# Patient Record
Sex: Female | Born: 2000 | Race: White | Hispanic: No | Marital: Single | Smoking: Never smoker
Health system: Southern US, Community
[De-identification: ages and names within clinical notes are randomized; demographics above are authoritative.]

## PROBLEM LIST (undated history)

## (undated) DIAGNOSIS — J189 Pneumonia, unspecified organism: Secondary | ICD-10-CM

## (undated) DIAGNOSIS — Z8669 Personal history of other diseases of the nervous system and sense organs: Secondary | ICD-10-CM

## (undated) DIAGNOSIS — Z973 Presence of spectacles and contact lenses: Secondary | ICD-10-CM

## (undated) DIAGNOSIS — Z8781 Personal history of (healed) traumatic fracture: Secondary | ICD-10-CM

## (undated) DIAGNOSIS — J181 Lobar pneumonia, unspecified organism: Secondary | ICD-10-CM

## (undated) DIAGNOSIS — T7840XA Allergy, unspecified, initial encounter: Secondary | ICD-10-CM

## (undated) DIAGNOSIS — S43001A Unspecified subluxation of right shoulder joint, initial encounter: Secondary | ICD-10-CM

## (undated) HISTORY — DX: Allergy, unspecified, initial encounter: T78.40XA

## (undated) HISTORY — DX: Personal history of other diseases of the nervous system and sense organs: Z86.69

## (undated) HISTORY — PX: TYMPANOSTOMY: SHX2586

## (undated) HISTORY — DX: Personal history of (healed) traumatic fracture: Z87.81

## (undated) HISTORY — DX: Presence of spectacles and contact lenses: Z97.3

## (undated) HISTORY — DX: Pneumonia, unspecified organism: J18.9

## (undated) HISTORY — DX: Lobar pneumonia, unspecified organism: J18.1

## (undated) HISTORY — DX: Unspecified subluxation of right shoulder joint, initial encounter: S43.001A

## (undated) HISTORY — PX: TYMPANOSTOMY TUBE PLACEMENT: SHX32

---
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
## 2018-03-06 ENCOUNTER — Telehealth: Payer: Self-pay | Admitting: Allergy and Immunology

## 2018-03-06 NOTE — Telephone Encounter (Signed)
Mom called to see if we have some samples and coupon for Xyzal for her daughter at college. They are going  There  tomorrow  [phone].

## 2018-03-06 NOTE — Telephone Encounter (Signed)
Got samples and coupon from nurse and called mom and she is coming to pick them up.

## 2018-05-16 ENCOUNTER — Other Ambulatory Visit (HOSPITAL_COMMUNITY)
Admission: RE | Admit: 2018-05-16 | Discharge: 2018-05-16 | Disposition: A | Discharge status: Home / Self Care | Payer: 59 | Source: Ambulatory Visit | Attending: Internal Medicine | Admitting: Internal Medicine

## 2018-05-16 ENCOUNTER — Encounter: Payer: Self-pay | Admitting: Internal Medicine

## 2018-05-16 ENCOUNTER — Ambulatory Visit: Payer: 59 | Admitting: Internal Medicine

## 2018-05-16 VITALS — BP 90/60 | HR 73 | Temp 97.9°F | Wt 136.8 lb

## 2018-05-16 DIAGNOSIS — Z113 Encounter for screening for infections with a predominantly sexual mode of transmission: Secondary | ICD-10-CM | POA: Diagnosis not present

## 2018-05-16 DIAGNOSIS — N898 Other specified noninflammatory disorders of vagina: Secondary | ICD-10-CM

## 2018-05-16 NOTE — Patient Instructions (Addendum)
This could be    BV  Bacterial vaginosis   But we can wait for results.   Checking for sti  Yeast and  BV germs  We can treat after results back or for BV    Vaginitis Vaginitis is a condition in which the vaginal tissue swells and becomes red (inflamed). This condition is most often caused by a change in the normal balance of bacteria and yeast that live in the vagina. This change causes an overgrowth of certain bacteria or yeast, which causes the inflammation. There are different types of vaginitis, but the most common types are:  Bacterial vaginosis.  Yeast infection (candidiasis).  Trichomoniasis vaginitis. This is a sexually transmitted disease (STD).  Viral vaginitis.  Atrophic vaginitis.  Allergic vaginitis.  What are the causes? The cause of this condition depends on the type of vaginitis. It can be caused by:  Bacteria (bacterial vaginosis).  Yeast, which is a fungus (yeast infection).  A parasite (trichomoniasis vaginitis).  A virus (viral vaginitis).  Low hormone levels (atrophic vaginitis). Low hormone levels can occur during pregnancy, breastfeeding, or after menopause.  Irritants, such as bubble baths, scented tampons, and feminine sprays (allergic vaginitis).  Other factors can change the normal balance of the yeast and bacteria that live in the vagina. These include:  Antibiotic medicines.  Poor hygiene.  Diaphragms, vaginal sponges, spermicides, birth control pills, and intrauterine devices (IUD).  Sex.  Infection.  Uncontrolled diabetes.  A weakened defense (immune) system.  What increases the risk? This condition is more likely to develop in women who:  Smoke.  Use vaginal douches, scented tampons, or scented sanitary pads.  Wear tight-fitting pants.  Wear thong underwear.  Use oral birth control pills or an IUD.  Have sex without a condom.  Have multiple sex partners.  Have an STD.  Frequently use the spermicide  nonoxynol-9.  Eat lots of foods high in sugar.  Have uncontrolled diabetes.  Have low estrogen levels.  Have a weakened immune system from an immune disorder or medical treatment.  Are pregnant or breastfeeding.  What are the signs or symptoms? Symptoms vary depending on the cause of the vaginitis. Common symptoms include:  Abnormal vaginal discharge. ? The discharge is white, gray, or yellow with bacterial vaginosis. ? The discharge is thick, white, and cheesy with a yeast infection. ? The discharge is frothy and yellow or greenish with trichomoniasis.  A bad vaginal smell. The smell is fishy with bacterial vaginosis.  Vaginal itching, pain, or swelling.  Sex that is painful.  Pain or burning when urinating.  Sometimes there are no symptoms. How is this diagnosed? This condition is diagnosed based on your symptoms and medical history. A physical exam, including a pelvic exam, will also be done. You may also have other tests, including:  Tests to determine the pH level (acidity or alkalinity) of your vagina.  A whiff test, to assess the odor that results when a sample of your vaginal discharge is mixed with a potassium hydroxide solution.  Tests of vaginal fluid. A sample will be examined under a microscope.  How is this treated? Treatment varies depending on the type of vaginitis you have. Your treatment may include:  Antibiotic creams or pills to treat bacterial vaginosis and trichomoniasis.  Antifungal medicines, such as vaginal creams or suppositories, to treat a yeast infection.  Medicine to ease discomfort if you have viral vaginitis. Your sexual partner should also be treated.  Estrogen delivered in a cream, pill,  suppository, or vaginal ring to treat atrophic vaginitis. If vaginal dryness occurs, lubricants and moisturizing creams may help. You may need to avoid scented soaps, sprays, or douches.  Stopping use of a product that is causing allergic vaginitis.  Then using a vaginal cream to treat the symptoms.  Follow these instructions at home: Lifestyle  Keep your genital area clean and dry. Avoid soap, and only rinse the area with water.  Do not douche or use tampons until your health care provider says it is okay to do so. Use sanitary pads, if needed.  Do not have sex until your health care provider approves. When you can return to sex, practice safe sex and use condoms.  Wipe from front to back. This avoids the spread of bacteria from the rectum to the vagina. General instructions  Take over-the-counter and prescription medicines only as told by your health care provider.  If you were prescribed an antibiotic medicine, take or use it as told by your health care provider. Do not stop taking or using the antibiotic even if you start to feel better.  Keep all follow-up visits as told by your health care provider. This is important. How is this prevented?  Use mild, non-scented products. Do not use things that can irritate the vagina, such as fabric softeners. Avoid the following products if they are scented: ? Feminine sprays. ? Detergents. ? Tampons. ? Feminine hygiene products. ? Soaps or bubble baths.  Let air reach your genital area. ? Wear cotton underwear to reduce moisture buildup. ? Avoid wearing underwear while you sleep. ? Avoid wearing tight pants and underwear or nylons without a cotton panel. ? Avoid wearing thong underwear.  Take off any wet clothing, such as bathing suits, as soon as possible.  Practice safe sex and use condoms. Contact a health care provider if:  You have abdominal pain.  You have a fever.  You have symptoms that last for more than 2-3 days. Get help right away if:  You have a fever and your symptoms suddenly get worse. Summary  Vaginitis is a condition in which the vaginal tissue becomes inflamed.This condition is most often caused by a change in the normal balance of bacteria and yeast  that live in the vagina.  Treatment varies depending on the type of vaginitis you have.  Do not douche, use tampons , or have sex until your health care provider approves. When you can return to sex, practice safe sex and use condoms. This information is not intended to replace advice given to you by your health care provider. Make sure you discuss any questions you have with your health care provider. Document Released: 09/16/2007 Document Revised: 12/25/2016 Document Reviewed: 12/25/2016 Elsevier Interactive Patient Education  [employer].

## 2018-05-16 NOTE — Progress Notes (Signed)
Chief Complaint  Patient presents with  . Vaginal Itching    x2-3 weeks, vaginal discharge which is white-gray in color intermittently    HPI: Breanna Taylor 17 y.o. come in for  SDA  No hx of same vaginal sx     Like itchy painful but no dc in past . Has fishy odor somewhat    Weeks ago .  No  recent antibiotics  lmp 1 month  Due any day    Regular  4 days.   On ocps  Over 2 year.  No rx.   1 partner  Months ago  Condoms  Not now  [employer]. Tight scented   At times  Fem products  ROS: See pertinent positives and negatives per HPI.  Past Medical History:  Diagnosis Date  . Allergy   . Hx of fracture of toe   . Hx of otitis media    tis  . Pneumonia   . RLL pneumonia (HCC) 11/20/2013  . Shoulder subluxation, right 04/21/2013  . Wears glasses    myopia hx of blepharitis    Family History  Problem Relation Age of Onset  . Healthy Sister   . Eczema Sister   . Asthma Mother   . Allergic rhinitis Neg Hx   . Angioedema Neg Hx   . Immunodeficiency Neg Hx   . Urticaria Neg Hx     Social History   Socioeconomic History  . Marital status: Single    Spouse name: Not on file  . Number of children: Not on file  . Years of education: Not on file  . Highest education level: Not on file  Occupational History  . Not on file  Social Needs  . Financial resource strain: Not on file  . Food insecurity:    Worry: Not on file    Inability: Not on file  . Transportation needs:    Medical: Not on file    Non-medical: Not on file  Tobacco Use  . Smoking status: Never Smoker  . Smokeless tobacco: Never Used  Substance and Sexual Activity  . Alcohol use: No  . Drug use: No  . Sexual activity: Not on file  Lifestyle  . Physical activity:    Days per week: Not on file    Minutes per session: Not on file  . Stress: Not on file  Relationships  . Social connections:    Talks on phone: Not on file    Gets together: Not on file    Attends religious service: Not  on file    Active member of club or organization: Not on file    Attends meetings of clubs or organizations: Not on file    Relationship status: Not on file  Other Topics Concern  . 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   To page   IB program                 Outpatient Medications Prior to Visit  Medication Sig Dispense Refill  . Ascorbic Acid (VITAMIN C) 100 MG tablet Take 100 mg by mouth daily.    . cetirizine (ZYRTEC) 10 MG tablet Take 1 tablet (10 mg total) by mouth daily. 90 tablet 2  . desogestrel-ethinyl estradiol (ENSKYCE) 0.15-30 MG-MCG tablet Take 1 tablet by mouth daily. 3 Package 3  . mometasone (NASONEX) 50 MCG/ACT nasal spray 1 spray per  nostril 1-2 times daily as needed. 51 g 2  . Olopatadine HCl (PAZEO) 0.7 % SOLN Place 1 drop into both eyes daily. 6 Bottle 0   No facility-administered medications prior to visit.      EXAM:  BP 90/60 (BP Location: Left Arm, Patient Position: Sitting, Cuff Size: Normal)   Pulse 73   Temp 97.9 F (36.6 C) (Oral)   Wt 136 lb 12.8 oz (62.1 kg)   LMP 04/15/2018 (Exact Date)   BMI 21.43 kg/m   Body mass index is 21.43 kg/m.  GENERAL: vitals reviewed and listed above, alert, oriented, appears well hydrated and in no acute distress HEENT: atraumatic, conjunctiva  clear, no obvious abnormalities on inspection of external nose and ears  Ext gu  Clear min erythema no lesions  cx 1+ flat ectopy .  Homogeneous  Creamy whit yellow   2+  aptima     gc  chl trich y bv  MS: moves all extremities without noticeable focal  abnormality PSYCH: pleasant and cooperative, no obvious depression or anxiety  BP Readings from Last 3 Encounters:  05/16/18 90/60  10/23/17 96/64  03/04/17 112/68 (51 %, Z = 0.02 /  56 %, Z = 0.15)*   *BP percentiles are based on the August 2017 AAP Clinical Practice Guideline for girls    ASSESSMENT AND PLAN:  Discussed the following assessment and  plan:  Vaginal itching - Plan: Cervicovaginal ancillary only  Vaginal discharge  -Patient advised to return or notify health care team  if  new concerns arise.  Patient Instructions    This could be    BV  Bacterial vaginosis   But we can wait for results.   Checking for sti  Yeast and  BV germs  We can treat after results back or for BV    Vaginitis Vaginitis is a condition in which the vaginal tissue swells and becomes red (inflamed). This condition is most often caused by a change in the normal balance of bacteria and yeast that live in the vagina. This change causes an overgrowth of certain bacteria or yeast, which causes the inflammation. There are different types of vaginitis, but the most common types are:  Bacterial vaginosis.  Yeast infection (candidiasis).  Trichomoniasis vaginitis. This is a sexually transmitted disease (STD).  Viral vaginitis.  Atrophic vaginitis.  Allergic vaginitis.  What are the causes? The cause of this condition depends on the type of vaginitis. It can be caused by:  Bacteria (bacterial vaginosis).  Yeast, which is a fungus (yeast infection).  A parasite (trichomoniasis vaginitis).  A virus (viral vaginitis).  Low hormone levels (atrophic vaginitis). Low hormone levels can occur during pregnancy, breastfeeding, or after menopause.  Irritants, such as bubble baths, scented tampons, and feminine sprays (allergic vaginitis).  Other factors can change the normal balance of the yeast and bacteria that live in the vagina. These include:  Antibiotic medicines.  Poor hygiene.  Diaphragms, vaginal sponges, spermicides, birth control pills, and intrauterine devices (IUD).  Sex.  Infection.  Uncontrolled diabetes.  A weakened defense (immune) system.  What increases the risk? This condition is more likely to develop in women who:  Smoke.  Use vaginal douches, scented tampons, or scented sanitary pads.  Wear tight-fitting  pants.  Wear thong underwear.  Use oral birth control pills or an IUD.  Have sex without a condom.  Have multiple sex partners.  Have an STD.  Frequently use the spermicide nonoxynol-9.  Eat lots of foods high in  sugar.  Have uncontrolled diabetes.  Have low estrogen levels.  Have a weakened immune system from an immune disorder or medical treatment.  Are pregnant or breastfeeding.  What are the signs or symptoms? Symptoms vary depending on the cause of the vaginitis. Common symptoms include:  Abnormal vaginal discharge. ? The discharge is white, gray, or yellow with bacterial vaginosis. ? The discharge is thick, white, and cheesy with a yeast infection. ? The discharge is frothy and yellow or greenish with trichomoniasis.  A bad vaginal smell. The smell is fishy with bacterial vaginosis.  Vaginal itching, pain, or swelling.  Sex that is painful.  Pain or burning when urinating.  Sometimes there are no symptoms. How is this diagnosed? This condition is diagnosed based on your symptoms and medical history. A physical exam, including a pelvic exam, will also be done. You may also have other tests, including:  Tests to determine the pH level (acidity or alkalinity) of your vagina.  A whiff test, to assess the odor that results when a sample of your vaginal discharge is mixed with a potassium hydroxide solution.  Tests of vaginal fluid. A sample will be examined under a microscope.  How is this treated? Treatment varies depending on the type of vaginitis you have. Your treatment may include:  Antibiotic creams or pills to treat bacterial vaginosis and trichomoniasis.  Antifungal medicines, such as vaginal creams or suppositories, to treat a yeast infection.  Medicine to ease discomfort if you have viral vaginitis. Your sexual partner should also be treated.  Estrogen delivered in a cream, pill, suppository, or vaginal ring to treat atrophic vaginitis. If vaginal  dryness occurs, lubricants and moisturizing creams may help. You may need to avoid scented soaps, sprays, or douches.  Stopping use of a product that is causing allergic vaginitis. Then using a vaginal cream to treat the symptoms.  Follow these instructions at home: Lifestyle  Keep your genital area clean and dry. Avoid soap, and only rinse the area with water.  Do not douche or use tampons until your health care provider says it is okay to do so. Use sanitary pads, if needed.  Do not have sex until your health care provider approves. When you can return to sex, practice safe sex and use condoms.  Wipe from front to back. This avoids the spread of bacteria from the rectum to the vagina. General instructions  Take over-the-counter and prescription medicines only as told by your health care provider.  If you were prescribed an antibiotic medicine, take or use it as told by your health care provider. Do not stop taking or using the antibiotic even if you start to feel better.  Keep all follow-up visits as told by your health care provider. This is important. How is this prevented?  Use mild, non-scented products. Do not use things that can irritate the vagina, such as fabric softeners. Avoid the following products if they are scented: ? Feminine sprays. ? Detergents. ? Tampons. ? Feminine hygiene products. ? Soaps or bubble baths.  Let air reach your genital area. ? Wear cotton underwear to reduce moisture buildup. ? Avoid wearing underwear while you sleep. ? Avoid wearing tight pants and underwear or nylons without a cotton panel. ? Avoid wearing thong underwear.  Take off any wet clothing, such as bathing suits, as soon as possible.  Practice safe sex and use condoms. Contact a health care provider if:  You have abdominal pain.  You have a fever.  You have  symptoms that last for more than 2-3 days. Get help right away if:  You have a fever and your symptoms suddenly get  worse. Summary  Vaginitis is a condition in which the vaginal tissue becomes inflamed.This condition is most often caused by a change in the normal balance of bacteria and yeast that live in the vagina.  Treatment varies depending on the type of vaginitis you have.  Do not douche, use tampons , or have sex until your health care provider approves. When you can return to sex, practice safe sex and use condoms. This information is not intended to replace advice given to you by your health care provider. Make sure you discuss any questions you have with your health care provider. Document Released: 09/16/2007 Document Revised: 12/25/2016 Document Reviewed: 12/25/2016 Elsevier Interactive Patient Education  2018 [employer]       WestlakeWanda K. Martisha Toulouse M.D.

## 2018-05-21 LAB — CERVICOVAGINAL ANCILLARY ONLY
Bacterial vaginitis: NEGATIVE
CANDIDA VAGINITIS: POSITIVE — AB
Chlamydia: NEGATIVE
Neisseria Gonorrhea: NEGATIVE
Trichomonas: NEGATIVE

## 2018-05-22 ENCOUNTER — Telehealth: Payer: Self-pay | Admitting: *Deleted

## 2018-05-22 NOTE — Telephone Encounter (Signed)
Copied from CRM [id number]. Topic: Inquiry >> May 22, 2018  1:21 PM Alexander BergeronBarksdale, Harvey B wrote: Reason for CRM: pt called to get lab results from last Friday, call pt to advise

## 2018-05-23 ENCOUNTER — Telehealth: Payer: Self-pay | Admitting: Internal Medicine

## 2018-05-23 MED ORDER — FLUCONAZOLE 150 MG PO TABS: 150.0000 mg | ORAL_TABLET | Freq: Once | ORAL | 0 refills | Status: AC

## 2018-05-23 NOTE — Addendum Note (Signed)
Addended by: Aniceto BossNIMMONS, SYLVIA A on: 05/23/2018 03:34 PM   Modules accepted: Orders

## 2018-05-23 NOTE — Telephone Encounter (Signed)
See open message 05/22/18

## 2018-05-23 NOTE — Telephone Encounter (Addendum)
See result note 05/16/18 Note [facility] to [facility] at 10:39AM after patient was called.

## 2018-05-23 NOTE — Telephone Encounter (Signed)
Copied from CRM [phone]. Topic: General - Other >> May 22, 2018  5:02 PM Marylen PontoMcneil, Ja-Kwan wrote: Reason for CRM: Pt called in again for lab results. Requests return call. Cb# [phone]

## 2018-05-23 NOTE — Telephone Encounter (Signed)
Patient states she leaves for the beach this evening and would like something called in before then. She states she has been waiting for 2 hours? Please advise.

## 2018-05-23 NOTE — Telephone Encounter (Signed)
Pt given results per notes of Dr. Fabian SharpPanosh on 05/22/18, patient verbalized understanding. She asks which is better to use, I advised both are effective, but the choice to make is using the cream for 3-7 days, depending on the type bought, and taking a pill for one time and not worrying about the cream. I advised with the pill, if the symptoms do not get better, she is to call back and ask for another prescriptions sent, she verbalized understanding. She asks how much will the pill cost, I advised to check with the pharmacy and they will be able to tell her once it is run through with the insurance, she verbalized understanding. Unable to document in result note due to [facility].

## 2018-05-23 NOTE — Telephone Encounter (Signed)
I spoke with pt and sent in script for Diflucan, this was suggested based on recent lab results from Dr. Fabian SharpPanosh.

## 2018-06-18 ENCOUNTER — Telehealth: Payer: Self-pay | Admitting: Internal Medicine

## 2018-06-18 NOTE — Telephone Encounter (Signed)
Received a call from patient Mother on 06/17/18 requesting explanation as to why specific testing was done on her daughter without parental consent, why certain tests were ordered which they received a bill for and Mother stating that the patient was very upset following her appt with Dr Fabian SharpPanosh. I discussed with limited information with Mother her concerns, Mother was advised that given the patient's age, being an adult, she gave verbal consent to evaluation and testing during her 05/16/18 OV with Dr Fabian SharpPanosh. Mother was advised that the testing done was routine for her age and symptoms at the time of her OV - which again was discussed in a confidential conversation between the patient and Dr Fabian SharpPanosh. Mother aware that we are working on the billing side trying to figure out and correct the out of pocket cost for this specific test. Mother aware that I will discuss all concerns with Dr Fabian SharpPanosh and my Office Manager Dustin whom she had spoken to first on 06/16/18 regarding the bill.  I spoke with Dr Fabian SharpPanosh and made aware of all concerns. Dr Fabian SharpPanosh confirmed that all evaluation and testing was discussed thoroughly with the patient and patient gave verbal consent to proceed. Dr Fabian SharpPanosh requests that I reach out to patient and discuss this further with the Patient. Follow up needed on current symptoms/questions/concerns. Aware that I would call patient to discuss.   Called patient and held discussion regarding phone call from her Mother and to follow up on her health since OV.  Pt was very open with me during our discussion, states that she held a lengthy conversation with her Mother regarding phones call made to our office while she was out of town and without her consent. Pt states that she filled out a DPR 05/16/18 [facility] - I advised that we do not have this scanned in the chart and the only DPR on file is 10/2017 which give consent to speak with both her mother and father. I advised that even  with the DPR there are tests with certain levels of sensitivity which we do not discuss with parents/guardians due to a level of HIPPA Violation if th patient is an Adult - pt advised that her HIPPA rights were respected when speaking with her mother on the phone. Pt states that she is doing well and though her symptoms did take about 10-12 to fully resolve, they are completely gone now and she has not residual issues. Pt states that she was okay following her exam on 05/16/18 but was a little shaken because she felt a little "pressure" during her discussion with Dr Fabian SharpPanosh to have the exam and testing/labs -- pt then stated that she knew from her discussion with Dr Fabian SharpPanosh that it was necessary and needed to be done given her history and symptoms. Pt states that what upset her more was the side conversation in the room while having the internal exam, pt states that she felt very uncomfortable and felt like the people in the room were not confident in what they were doing. Pt states that she kept hearing "I dont know what to do, I dont know where this goes..." and she felt the exam too way too long.  I apologized for her feeling uncomfortable at all and she then stated that she feels better about it now but wanted to bring awareness to our office to maybe not talk about it things where the patient can hear.  Pt advised to be sure to fill  out an updated DPR when she comes into the office the next time - pt requests in the meantime that we ONLY speak her regarding her health. Pt states that she has no hard feelings toward our office or any staff and she appreciated the follow up call and being able to discuss her feelings and "vent" all of her concerns.   Pt aware that we are still working on the billing issue and that we will contact her as soon as we have an answer regarding next steps.

## 2018-06-20 NOTE — Telephone Encounter (Signed)
Spoke with Stephanie(lab) and Dustin Public affairs consultant(office manager) regarding next steps with the billing of the patients STI testing.  Judeth CornfieldStephanie reached [facility] and requested assistance in fixing this billing error.  The bill the patient received for testing is believed to be a coding error on behalf of our office.  When running the STI testing it should have been coded with [Z11.3] Routine Screening for STI but instead was coded with Vaginal itching [N89.8].  Per Jama FlavorsAlina with the Lab, she states that a letter needs to be typed up on our office letterhead including, Patient Name DOB Explanation of what happened New Diagnosis Code  [Z11.3] Routine Screening for STI Must be signed my Provider - not stamped  With date/time  Please fax to AttnJama Flavors: Alina  Fax# [phone]

## 2018-06-24 NOTE — Telephone Encounter (Signed)
Letter typed, signed and faxed to [facility].  Fax# [phone]

## 2018-07-01 ENCOUNTER — Telehealth: Payer: Self-pay | Admitting: Internal Medicine

## 2018-07-01 MED ORDER — FLUCONAZOLE 150 MG PO TABS: 150.0000 mg | ORAL_TABLET | Freq: Once | ORAL | 0 refills | Status: AC

## 2018-07-01 NOTE — Telephone Encounter (Signed)
Please advise Dr Fabian SharpPanosh, thanks.  Pt has declined an appointment with Dr Fabian SharpPanosh and is requesting something be called into the pharmacy for another yeast infection.

## 2018-07-01 NOTE — Telephone Encounter (Signed)
Copied from CRM [phone]. Topic: Quick Communication - See Telephone Encounter >> Jul 01, 2018  2:01 PM Terisa Starraylor, Brittany L wrote: CRM for notification. See Telephone encounter for: 07/01/18.  Patient states she she had a yeast infection and was in the office on June 14 th. She said that now she has another yeast infection of about 6 days. Patient declined appointment and wants Dr Rosezella FloridaPanosh's nurse to give her a call back.

## 2018-07-01 NOTE — Telephone Encounter (Signed)
If symptoms are the same as last time  Can  Repeat   Diflucan 150 mg disp 1#  1 time    If recurring  Would next  Try terazole  vaginal  rx   Sometimes the yeast are resistant to the diflucan .

## 2018-07-01 NOTE — Telephone Encounter (Signed)
Pt notified of instructions and verbalized understanding. She states symptoms are the same as last time, although not as intense yet. She states she wants to catch this one before it progresses. Diflucan filled to pharmacy as requested. No further needs at this time.

## 2018-07-02 NOTE — Telephone Encounter (Signed)
Will send to Mickeal Skinnerustin and Carolyn for further follow up on billing side.  Thanks!

## 2018-09-12 ENCOUNTER — Ambulatory Visit (INDEPENDENT_AMBULATORY_CARE_PROVIDER_SITE_OTHER): Payer: Managed Care, Other (non HMO)

## 2018-09-12 DIAGNOSIS — Z23 Encounter for immunization: Secondary | ICD-10-CM | POA: Diagnosis not present

## 2018-10-28 NOTE — Progress Notes (Deleted)
No chief complaint on file.   HPI: Patient  Breanna Taylor  16 y.o. comes in today for Preventive Health Care visit   hda sane eval october  Health Maintenance  Topic Date Due  . HIV Screening  04/04/2014  . TETANUS/TDAP  05/09/2021  . INFLUENZA VACCINE  Completed   Health Maintenance Review LIFESTYLE:  Exercise:   Tobacco/ETS: Alcohol:  Sugar beverages: Sleep: Drug use: no HH of  Work:    ROS:  GEN/ HEENT: No fever, significant weight changes sweats headaches vision problems hearing changes, CV/ PULM; No chest pain shortness of breath cough, syncope,edema  change in exercise tolerance. GI /GU: No adominal pain, vomiting, change in bowel habits. No blood in the stool. No significant GU symptoms. SKIN/HEME: ,no acute skin rashes suspicious lesions or bleeding. No lymphadenopathy, nodules, masses.  NEURO/ PSYCH:  No neurologic signs such as weakness numbness. No depression anxiety. IMM/ Allergy: No unusual infections.  Allergy .   REST of 12 system review negative except as per HPI   Past Medical History:  Diagnosis Date  . Allergy   . Hx of fracture of toe   . Hx of otitis media    tis  . Pneumonia   . RLL pneumonia (HCC) 11/20/2013  . Shoulder subluxation, right 04/21/2013  . Wears glasses    myopia hx of blepharitis    Past Surgical History:  Procedure Laterality Date  . TYMPANOSTOMY    . TYMPANOSTOMY TUBE PLACEMENT      Family History  Problem Relation Age of Onset  . Healthy Sister   . Eczema Sister   . Asthma Mother   . Allergic rhinitis Neg Hx   . Angioedema Neg Hx   . Immunodeficiency Neg Hx   . Urticaria Neg Hx     Social History   Socioeconomic History  . Marital status: Single    Spouse name: Not on file  . Number of children: Not on file  . Years of education: Not on file  . Highest education level: Not on file  Occupational History  . Not on file  Social Needs  . Financial resource strain: Not on file  . Food insecurity:   Worry: Not on file    Inability: Not on file  . Transportation needs:    Medical: Not on file    Non-medical: Not on file  Tobacco Use  . Smoking status: Never Smoker  . Smokeless tobacco: Never Used  Substance and Sexual Activity  . Alcohol use: No  . Drug use: No  . Sexual activity: Not on file  Lifestyle  . Physical activity:    Days per week: Not on file    Minutes per session: Not on file  . Stress: Not on file  Relationships  . Social connections:    Talks on phone: Not on file    Gets together: Not on file    Attends religious service: Not on file    Active member of club or organization: Not on file    Attends meetings of clubs or organizations: Not on file    Relationship status: Not on file  Other Topics Concern  . 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   To page   IB program                 Outpatient Medications Prior to Visit  Medication Sig Dispense Refill  . Ascorbic  Acid (VITAMIN C) 100 MG tablet Take 100 mg by mouth daily.    . cetirizine (ZYRTEC) 10 MG tablet Take 1 tablet (10 mg total) by mouth daily. 90 tablet 2  . desogestrel-ethinyl estradiol (ENSKYCE) 0.15-30 MG-MCG tablet Take 1 tablet by mouth daily. 3 Package 3  . mometasone (NASONEX) 50 MCG/ACT nasal spray 1 spray per nostril 1-2 times daily as needed. 51 g 2  . Olopatadine HCl (PAZEO) 0.7 % SOLN Place 1 drop into both eyes daily. 6 Bottle 0   No facility-administered medications prior to visit.      EXAM:  There were no vitals taken for this visit.  There is no height or weight on file to calculate BMI. Wt Readings from Last 3 Encounters:  05/16/18 136 lb 12.8 oz (62.1 kg) (67 %, Z= 0.44)*  10/23/17 132 lb 8 oz (60.1 kg) (63 %, Z= 0.33)*  03/04/17 123 lb (55.8 kg) (49 %, Z= -0.03)*   * Growth percentiles are based on CDC (Girls, 2-20 Years) data.    Physical Exam: Vital signs reviewed ONG:EXBMGEN:This is a well-developed well-nourished  alert cooperative    who appearsr stated age in no acute distress.  HEENT: normocephalic atraumatic , Eyes: PERRL EOM's full, conjunctiva clear, Nares: paten,t no deformity discharge or tenderness., Ears: no deformity EAC's clear TMs with normal landmarks. Mouth: clear OP, no lesions, edema.  Moist mucous membranes. Dentition in adequate repair. NECK: supple without masses, thyromegaly or bruits. CHEST/PULM:  Clear to auscultation and percussion breath sounds equal no wheeze , rales or rhonchi. No chest wall deformities or tenderness. Breast: normal by inspection . No dimpling, discharge, masses, tenderness or discharge . CV: PMI is nondisplaced, S1 S2 no gallops, murmurs, rubs. Peripheral pulses are full without delay.No JVD .  ABDOMEN: Bowel sounds normal nontender  No guard or rebound, no hepato splenomegal no CVA tenderness.  No hernia. Extremtities:  No clubbing cyanosis or edema, no acute joint swelling or redness no focal atrophy NEURO:  Oriented x3, cranial nerves 3-12 appear to be intact, no obvious focal weakness,gait within normal limits no abnormal reflexes or asymmetrical SKIN: No acute rashes normal turgor, color, no bruising or petechiae. PSYCH: Oriented, good eye contact, no obvious depression anxiety, cognition and judgment appear normal. LN: no cervical axillary inguinal adenopathy  Lab Results  Component Value Date   WBC 6.0 10/24/2016   HGB 13.3 10/24/2016   HCT 40.0 10/24/2016   PLT 281.0 10/24/2016   GLUCOSE 86 10/24/2016   CHOL 185 10/24/2016   TRIG 85.0 10/24/2016   HDL 66.80 10/24/2016   LDLCALC 101 (H) 10/24/2016   ALT 11 10/24/2016   AST 14 10/24/2016   NA 140 10/24/2016   K 4.9 10/24/2016   CL 106 10/24/2016   CREATININE 0.73 10/24/2016   BUN 10 10/24/2016   CO2 24 10/24/2016   TSH 1.00 10/24/2016    BP Readings from Last 3 Encounters:  05/16/18 90/60  10/23/17 96/64  03/04/17 112/68 (51 %, Z = 0.02 /  56 %, Z = 0.15)*   *BP percentiles are based on  the August 2017 AAP Clinical Practice Guideline for girls      ASSESSMENT AND PLAN:  Discussed the following assessment and plan:  Visit for preventive health examination  Patient Care Team: Panosh, Neta MendsWanda K, MD as PCP - General (Internal Medicine) Wallis Martedgerton Kathryn (Podiatry) Fletcher AnonBardelas, Jose A, MD as Consulting Physician (Allergy and Immunology) There are no Patient Instructions on file for this visit.  Neta MendsWanda K.  Panosh M.D.

## 2018-10-29 ENCOUNTER — Encounter: Payer: 59 | Admitting: Internal Medicine

## 2022-11-23 ENCOUNTER — Ambulatory Visit (INDEPENDENT_AMBULATORY_CARE_PROVIDER_SITE_OTHER): Payer: 59

## 2022-11-23 ENCOUNTER — Ambulatory Visit
Admission: EM | Admit: 2022-11-23 | Discharge: 2022-11-23 | Disposition: A | Discharge status: Home / Self Care | Payer: 59 | Attending: Urgent Care | Admitting: Urgent Care

## 2022-11-23 ENCOUNTER — Ambulatory Visit (HOSPITAL_COMMUNITY): Admission: EM | Admit: 2022-11-23 | Discharge: 2022-11-23 | Discharge status: Against Medical Advice | Payer: Self-pay

## 2022-11-23 DIAGNOSIS — S93402A Sprain of unspecified ligament of left ankle, initial encounter: Secondary | ICD-10-CM

## 2022-11-23 DIAGNOSIS — M25572 Pain in left ankle and joints of left foot: Secondary | ICD-10-CM | POA: Diagnosis not present

## 2022-11-23 MED ORDER — NAPROXEN 500 MG PO TABS: 500.0000 mg | ORAL_TABLET | Freq: Two times a day (BID) | ORAL | 0 refills | Status: AC

## 2022-11-23 NOTE — ED Triage Notes (Signed)
Pt c/o twisting left ankle wile hiking ~1-2 hours PTA-to tx room via crutches-NAD

## 2022-11-23 NOTE — ED Provider Notes (Signed)
[facility]  Note:  This document was prepared using Conservation officer, historic buildings and may include unintentional dictation errors.  MRN: [id number] DOB: October 20, 2001  Subjective:   Breanna Taylor is a 21 y.o. female presenting for rolling her left ankle laterally while hiking today.  Patient not since felt significant pain, swelling and difficulty bearing weight.  She is ambulating with crutches.  No current facility-administered medications for this encounter.  Current Outpatient Medications:    Ascorbic Acid (VITAMIN C) 100 MG tablet, Take 100 mg by mouth daily., Disp: , Rfl:    cetirizine (ZYRTEC) 10 MG tablet, Take 1 tablet (10 mg total) by mouth daily., Disp: 90 tablet, Rfl: 2   desogestrel-ethinyl estradiol (ENSKYCE) 0.15-30 MG-MCG tablet, Take 1 tablet by mouth daily., Disp: 3 Package, Rfl: 3   mometasone (NASONEX) 50 MCG/ACT nasal spray, 1 spray per nostril 1-2 times daily as needed., Disp: 51 g, Rfl: 2   naproxen (NAPROSYN) 500 MG tablet, Take 1 tablet (500 mg total) by mouth 2 (two) times daily with a meal., Disp: 30 tablet, Rfl: 0   Olopatadine HCl (PAZEO) 0.7 % SOLN, Place 1 drop into both eyes daily., Disp: 6 Bottle, Rfl: 0   Allergies  Allergen Reactions   Apple Juice     Had lab testing skin and  Get tinfling in mouth    Hazel Tree Pollen [Corylus]     Skin testing     Other Other (See Comments)    Hazelnuts and almonds cause bumps on lips and tingling sensation in the tongue   Shellfish Allergy     Past Medical History:  Diagnosis Date   Allergy    Hx of fracture of toe    Hx of otitis media    tis   Pneumonia    RLL pneumonia 11/20/2013   Shoulder subluxation, right 04/21/2013   Wears glasses    myopia hx of blepharitis     Past Surgical History:  Procedure Laterality Date   TYMPANOSTOMY     TYMPANOSTOMY TUBE PLACEMENT      Family History  Problem Relation Age of Onset   Healthy Sister    Eczema Sister    Asthma Mother     Allergic rhinitis Neg Hx    Angioedema Neg Hx    Immunodeficiency Neg Hx    Urticaria Neg Hx     Social History   Tobacco Use   Smoking status: Never   Smokeless tobacco: Never  Vaping Use   Vaping Use: Never used  Substance Use Topics   Alcohol use: Yes    Comment: occ   Drug use: No    ROS   Objective:   Vitals: BP 121/75 (BP Location: Left Arm)   Pulse 93   Temp 98.3 F (36.8 C) (Oral)   Resp 16   LMP 11/21/2022   SpO2 97%   Physical Exam Constitutional:      General: She is not in acute distress.    Appearance: Normal appearance. She is well-developed. She is not ill-appearing, toxic-appearing or diaphoretic.  HENT:     Head: Normocephalic and atraumatic.     Nose: Nose normal.     Mouth/Throat:     Mouth: Mucous membranes are moist.  Eyes:     General: No scleral icterus.       Right eye: No discharge.        Left eye: No discharge.     Extraocular Movements: Extraocular movements intact.  Cardiovascular:  Rate and Rhythm: Normal rate.  Pulmonary:     Effort: Pulmonary effort is normal.  Musculoskeletal:     Left ankle: Swelling and ecchymosis present. No deformity or lacerations. Tenderness present over the ATF ligament and AITF ligament. No lateral malleolus, medial malleolus, CF ligament, posterior TF ligament, base of 5th metatarsal or proximal fibula tenderness. Decreased range of motion.     Left Achilles Tendon: No tenderness or defects. Thompson's test negative.  Skin:    General: Skin is warm and dry.  Neurological:     General: No focal deficit present.     Mental Status: She is alert and oriented to person, place, and time.  Psychiatric:        Mood and Affect: Mood normal.        Behavior: Behavior normal.     DG Ankle Complete Left  Result Date: 11/23/2022 CLINICAL DATA:  Left ankle pain EXAM: LEFT ANKLE COMPLETE - 3+ VIEW COMPARISON:  None Available. FINDINGS: There is no evidence of fracture, dislocation, or joint effusion.  There is no evidence of arthropathy or other focal bone abnormality. Mild lateral soft tissue swelling. No ankle effusion. IMPRESSION: 1. Lateral soft tissue swelling. No fracture or dislocation. Electronically Signed   By: Helyn Numbers M.D.   On: 11/23/2022 19:19    Left ankle wrapped using 4" Ace wrap in figure-8 method.  Assessment and Plan :   PDMP not reviewed this encounter.  1. Sprain of left ankle, unspecified ligament, initial encounter   2. Acute left ankle pain     Will manage for ankle sprain with rice method, NSAID. Counseled patient on potential for adverse effects with medications prescribed/recommended today, ER and return-to-clinic precautions discussed, patient verbalized understanding.    Wallis B[place], [place] 11/24/22 [phone]
